# Patient Record
Sex: Female | Born: 1960 | Race: White | Hispanic: No | Smoking: Former smoker
Health system: Southern US, Community
[De-identification: ages and names within clinical notes are randomized; demographics above are authoritative.]

## PROBLEM LIST (undated history)

## (undated) DIAGNOSIS — E785 Hyperlipidemia, unspecified: Secondary | ICD-10-CM

## (undated) DIAGNOSIS — I10 Essential (primary) hypertension: Secondary | ICD-10-CM

## (undated) HISTORY — PX: CARPAL TUNNEL RELEASE: SHX101

---

## 2009-12-31 ENCOUNTER — Ambulatory Visit: Payer: Self-pay | Admitting: Family Medicine

## 2009-12-31 DIAGNOSIS — I10 Essential (primary) hypertension: Secondary | ICD-10-CM | POA: Insufficient documentation

## 2009-12-31 DIAGNOSIS — J069 Acute upper respiratory infection, unspecified: Secondary | ICD-10-CM | POA: Insufficient documentation

## 2010-01-01 ENCOUNTER — Telehealth: Payer: Self-pay | Admitting: Family Medicine

## 2010-01-03 ENCOUNTER — Telehealth (INDEPENDENT_AMBULATORY_CARE_PROVIDER_SITE_OTHER): Payer: Self-pay

## 2010-01-07 ENCOUNTER — Encounter: Admission: RE | Admit: 2010-01-07 | Discharge: 2010-01-07 | Payer: Self-pay | Admitting: Unknown Physician Specialty

## 2010-02-18 ENCOUNTER — Encounter: Admission: RE | Admit: 2010-02-18 | Discharge: 2010-02-18 | Payer: Self-pay | Admitting: Unknown Physician Specialty

## 2010-09-07 NOTE — Progress Notes (Signed)
  Phone Note Call from Patient   Summary of Call: Patient called she states she is still running a temp of 101.7. She wants to know if we can call  in a inhaler for her. The [address] on [address] in [place] @ [phone].Patient would like for you to call her.  [phone] Initial call taken by: Dannette Barbara,  Jan 01, 2010 1:27 PM    New/Updated Medications: PROAIR HFA 108 (90 BASE) MCG/ACT AERS (ALBUTEROL SULFATE) Two inhalations q4-6hr as needed.  Max 12 puffs/day  Sent Rx for  [employer].  Also notified patient that she should start the amoxicillin since she is still having a fever. Donna Christen MD  Jan 01, 2010 1:46 PM    Prescriptions: PROAIR HFA 108 (90 BASE) MCG/ACT AERS (ALBUTEROL SULFATE) Two inhalations q4-6hr as needed.  Max 12 puffs/day  #1 MDI x 0   Entered and Authorized by:   Donna Christen MD   Signed by:   Donna Christen MD on 01/01/2010   Method used:   Electronically to        [facility]  [address] [phone]* (retail)       [address]       [place], [place]  [address]       Ph: [phone]       Fax: [phone]   RxID:   [id number]

## 2010-09-07 NOTE — Letter (Signed)
Summary: Out of Work  [facility]  [address]   [place], [place] [address]   Phone: [phone]  Fax: [phone]    Dec 31, 2009   Employee:  Chloe Stout    To Whom It May Concern:   For Medical reasons, please excuse the above named employee from work today and tomorrow.    If you need additional information, please feel free to contact our office.         Sincerely,    Donna Christen MD

## 2010-09-07 NOTE — Assessment & Plan Note (Signed)
Summary: Cough/runny nose- yellowish, sorethroat, chest congestion, fever   Vital Signs:  Patient Profile:   50 Years Old Female CC:      Cold & URI symptoms Height:     70.5 inches Weight:      239 pounds O2 Sat:      100 % O2 treatment:    Room Air Temp:     100.8 degrees F oral Pulse rate:   99 / minute Pulse rhythm:   regular Resp:     16 per minute BP sitting:   148 / 100  (right arm) Cuff size:   regular  Vitals Entered By: Areta Haber CMA (Dec 31, 2009 8:26 AM)                  Current Allergies: No known allergies History of Present Illness Chief Complaint: Cold & URI symptoms History of Present Illness: Subjective: Patient complains of sore throat that started one week ago.  She then developed nasal congestion and cough.  She has had a fever for the past two days.   No pleuritic pain No wheezing + post-nasal drainage ? sinus pain/pressure No itchy/red eyes No earache No hemoptysis No SOB No nausea No vomiting No abdominal pain No diarrhea No skin rashes + fatigue + myalgias + headache Used OTC meds without relief   Current Problems: URI (ICD-465.9) FAMILY HISTORY BREAST CANCER 1ST DEGREE RELATIVE <50 (ICD-V16.3) HYPERTENSION (ICD-401.9)   Current Meds EFFEXOR XR 150 MG XR24H-CAP (VENLAFAXINE HCL) 1 tab by mouth once daily HYZAAR 100-25 MG TABS (LOSARTAN POTASSIUM-HCTZ) 1 tab by mouth once daily MUCINEX D 60-600 MG XR12H-TAB (PSEUDOEPHEDRINE-GUAIFENESIN) as directed ZYRTEC HIVES RELIEF 10 MG TABS (CETIRIZINE HCL) 1 tab by mouth once daily ADVIL 200 MG TABS (IBUPROFEN) as directed VALTREX 1 GM TABS (VALACYCLOVIR HCL) 1 tab q 12 hrs as needed BENZONATATE 200 MG CAPS (BENZONATATE) One by mouth hs as needed cough FLUTICASONE PROPIONATE 50 MCG/ACT SUSP (FLUTICASONE PROPIONATE) 2 sprays in each nostril once daily AMOXICILLIN 875 MG TABS (AMOXICILLIN) One by mouth two times a day (Rx void after 01/09/10)  REVIEW OF SYSTEMS Constitutional  Symptoms       Complains of fever and chills.     Denies night sweats, weight loss, weight gain, and fatigue.  Eyes       Denies change in vision, eye pain, eye discharge, glasses, contact lenses, and eye surgery. Ear/Nose/Throat/Mouth       Complains of frequent runny nose, sinus problems, sore throat, and hoarseness.      Denies hearing loss/aids, change in hearing, ear pain, ear discharge, dizziness, frequent nose bleeds, and tooth pain or bleeding.      Comments: yellowish x 3 dys Respiratory       Complains of productive cough.      Denies dry cough, wheezing, shortness of breath, asthma, bronchitis, and emphysema/COPD.      Comments: chest congestion Cardiovascular       Denies murmurs, chest pain, and tires easily with exhertion.    Gastrointestinal       Denies stomach pain, nausea/vomiting, diarrhea, constipation, blood in bowel movements, and indigestion. Genitourniary       Denies painful urination, kidney stones, and loss of urinary control. Neurological       Complains of headaches.      Denies paralysis, seizures, and fainting/blackouts. Musculoskeletal       Denies muscle pain, joint pain, joint stiffness, decreased range of motion, redness, swelling, muscle weakness, and gout.  Skin  Denies bruising, unusual mles/lumps or sores, and hair/skin or nail changes.  Psych       Denies mood changes, temper/anger issues, anxiety/stress, speech problems, depression, and sleep problems. Other Comments: yellowish x 3 dys . Pt has not seen PCP.   Past History:  Past Medical History: Hypertension  Past Surgical History: Tubal ligation  Family History: Family History Breast cancer 1st degree relative <50 Family History Lung cancer Family History Other cancer Family History of Cardiovascular disorder  Social History: Divorced Never Smoked Alcohol use-yes - 4-5 weekly Drug use-no Regular exercise-no Smoking Status:  never Drug Use:  no Does Patient Exercise:   no   Objective:  No acute distress  Eyes:  Pupils are equal, round, and reactive to light and accomdation.  Extraocular movement is intact.  Conjunctivae are not inflamed.  Ears:  Canals normal.  Tympanic membranes normal.   Nose:  Normal septum.  Normal turbinates, mildly congested.   No sinus tenderness present.  Pharynx:  Tonsils enlarged and mildly erythematous Neck:  Supple.  No adenopathy is present.   Lungs:  Clear to auscultation.  Breath sounds are equal.  Heart:  Regular rate and rhythm without murmurs, rubs, or gallops.  Abdomen:  Nontender without masses or hepatosplenomegaly.  Bowel sounds are present.  No CVA or flank tenderness.  Extremities:  No edema.  Rapid strep test negative  Assessment New Problems: URI (ICD-465.9) FAMILY HISTORY BREAST CANCER 1ST DEGREE RELATIVE <50 (ICD-V16.3) HYPERTENSION (ICD-401.9)   Plan New Medications/Changes: AMOXICILLIN 875 MG TABS (AMOXICILLIN) One by mouth two times a day (Rx void after 01/09/10)  #20 x 0, 12/31/2009, Donna Christen MD FLUTICASONE PROPIONATE 50 MCG/ACT SUSP (FLUTICASONE PROPIONATE) 2 sprays in each nostril once daily  #One x 0, 12/31/2009, Donna Christen MD BENZONATATE 200 MG CAPS (BENZONATATE) One by mouth hs as needed cough  #12 x 0, 12/31/2009, Donna Christen MD  New Orders: New Patient Level III [99203] Rapid Strep [87880] T-Culture, Throat [[id number]] Planning Comments:   Throat culture pending. Treat symptomatically for now:  Continue expectorant.  Discontinue Zyrtec.  Add cough suppressant at night. If symptoms do not improve over next 5 to 7 days, add amoxicillin (or if throat culture positive) Follow-up with PCP.   The patient and/or caregiver has been counseled thoroughly with regard to medications prescribed including dosage, schedule, interactions, rationale for use, and possible side effects and they verbalize understanding.  Diagnoses and expected course of recovery discussed and will return if not  improved as expected or if the condition worsens. Patient and/or caregiver verbalized understanding.  Prescriptions: AMOXICILLIN 875 MG TABS (AMOXICILLIN) One by mouth two times a day (Rx void after 01/09/10)  #20 x 0   Entered and Authorized by:   Donna Christen MD   Signed by:   Donna Christen MD on 12/31/2009   Method used:   Print then Give to Patient   RxID:   [id number] FLUTICASONE PROPIONATE 50 MCG/ACT SUSP (FLUTICASONE PROPIONATE) 2 sprays in each nostril once daily  #One x 0   Entered and Authorized by:   Donna Christen MD   Signed by:   Donna Christen MD on 12/31/2009   Method used:   Print then Give to Patient   RxID:   [id number] BENZONATATE 200 MG CAPS (BENZONATATE) One by mouth hs as needed cough  #12 x 0   Entered and Authorized by:   Donna Christen MD   Signed by:   Donna Christen MD on 12/31/2009   Method used:  Print then Give to Patient   RxID:   [id number]   Patient Instructions: 1)  Use Mucinex  (guaifenesin) twice daily for congestion. 2)  Increase fluid intake, rest. 3)  May use Afrin nasal spray (or generic oxymetazoline) once or twice daily for about 5 days.  Use the Afrin about 15 minutes before using the fluticasone spray.   Also recommend using saline nasal spray several times daily and/or saline nasal irrigation. 4)  Begin amoxicillin if fever persists or increasing facial pain develops. 5)  Followup with family doctor if not improving 7 to 10 days.

## 2010-09-07 NOTE — Progress Notes (Signed)
Summary: Request info for relief of cough  Phone Note Call from Patient   Summary of Call: Pt. left message while office was closed, stated "SX's are improving but having dry-unproductive cough that gives HA", would she benefit from an expectant or something for relieve? Please advise. Joanne Chars CMA  Jan 03, 2010 1:53 PM   Initial call taken by: Joanne Chars    New/Updated Medications: PREDNISONE 10 MG TABS (PREDNISONE) 2 PO BID for 2 days, then 1 BID for 2 days, then 1 daily for 2 days.  Take PC Prescriptions: PREDNISONE 10 MG TABS (PREDNISONE) 2 PO BID for 2 days, then 1 BID for 2 days, then 1 daily for 2 days.  Take PC  #14 x 0   Entered and Authorized by:   Donna Christen MD   Signed by:   Donna Christen MD on 01/04/2010   Method used:   Electronically to        [facility]  [address] [phone]* (retail)       [address]       [place], [place]  [address]       Ph: [phone]       Fax: [phone]   RxID:   [id number]    Is she taking Mucinex (plain) with lots of fluids during the day? Donna Christen MD  Jan 03, 2010 4:39 PM    Pt states she is taking Mucinex (plain) and drinking lots of fluids. Pt states she is able to blow her nose but unable to cough anything up. Pt states her chest is still tight. Areta Haber CMA  Jan 04, 2010 9:14 AM   Since fever has resolved, will add tapering course of prednisone. Return for follow-up if not improved 2 to 3 days. Donna Christen MD  Jan 04, 2010 9:31 AM   Pt notified. Areta Haber CMA  Jan 04, 2010 10:40 AM

## 2013-06-25 ENCOUNTER — Ambulatory Visit (INDEPENDENT_AMBULATORY_CARE_PROVIDER_SITE_OTHER): Payer: 59 | Admitting: Sports Medicine

## 2013-06-25 ENCOUNTER — Ambulatory Visit (INDEPENDENT_AMBULATORY_CARE_PROVIDER_SITE_OTHER): Payer: 59

## 2013-06-25 ENCOUNTER — Encounter: Payer: Self-pay | Admitting: Sports Medicine

## 2013-06-25 VITALS — BP 132/87 | HR 80 | Wt 229.0 lb

## 2013-06-25 DIAGNOSIS — M5030 Other cervical disc degeneration, unspecified cervical region: Secondary | ICD-10-CM

## 2013-06-25 DIAGNOSIS — I10 Essential (primary) hypertension: Secondary | ICD-10-CM

## 2013-06-25 DIAGNOSIS — M546 Pain in thoracic spine: Secondary | ICD-10-CM | POA: Insufficient documentation

## 2013-06-25 MED ORDER — PREDNISONE 50 MG PO TABS: ORAL_TABLET | ORAL | Status: DC

## 2013-06-25 NOTE — Assessment & Plan Note (Signed)
Likely a strain with associated left-sided T10 radiculitis. Prednisone, home exercises, x-rays, continue Flexeril. Return in one month, MRI if no better.

## 2013-06-25 NOTE — Progress Notes (Addendum)
Patient ID: Chloe Stout, female   DOB: 01/09/1961, 52 y.o.   MRN: [id number]   Subjective:    I'm seeing this patient as a consultation for back pain for Dr. Harl Bowie.  CC: My back hurts on my left side.   HPI: The patient reports that her back that started hurting on November 4th. She was taking a shower and she felt a tight, aching feeling in her back. Since then, the tightness hasn't gotten any better. She has seen the chiropractor 3 times since the incident for readjustment and this hasn't helped. She has tried ice and heat with some improvement. She reports pain with sitting on her desk for a long time  and sometimes when leaning over.   She denies any numbness or tingling sensation with this pain. The location of the pain is on her mid back and radiates to the umbilicus along the T10 dermatome. Symptoms are mild, improving.  Past medical history, Surgical history, Family history not pertinant except as noted below, Social history, Allergies, and medications have been entered into the medical record, reviewed, and no changes needed.   Review of Systems: No headache, visual changes, nausea, vomiting, diarrhea, constipation, dizziness, abdominal pain, skin rash, fevers, chills, night sweats, weight loss, swollen lymph nodes, body aches, joint swelling, muscle aches, chest pain, shortness of breath, mood changes, visual or auditory hallucinations.   Objective:   General: Well Developed, well nourished, and in no acute distress.  Neuro/Psych: Alert and oriented x3, extra-ocular muscles intact, able to move all 4 extremities, sensation grossly intact. Skin: Warm and dry, no rashes noted.  Respiratory: Not using accessory muscles, speaking in full sentences, trachea midline.  Cardiovascular: Pulses palpable, no extremity edema. Abdomen: Does not appear distended. Back Exam:  Inspection: Unremarkable  Motion: Flexion 45 deg, Extension 45 deg, Side Bending to 45 deg bilaterally,  Rotation to  45 deg bilaterally  SLR laying: Negative  XSLR laying: Negative  Palpable tenderness: No tenderness noted today on exam. FABER: negative. Sensory change: Gross sensation intact to all lumbar and sacral dermatomes.  Reflexes: 2+ at both patellar tendons, 2+ at achilles tendons, Babinski's downgoing.   Thoracic spine x-rays are negative, but they do show some lower cervical degenerative changes.  Impression and Recommendations:   This case required medical decision making of moderate complexity.  1) Left sided thoracic pain- Likely T10 radiculitis. At this time, we will obtain a T-spine x-ray.   -Continue compression as well as Flexeril.  -Patient advised to contact physician if symptoms worsen. -Follow up visit in a month to follow up with symptoms.

## 2013-07-16 ENCOUNTER — Ambulatory Visit: Payer: Self-pay | Admitting: Sports Medicine

## 2014-05-13 ENCOUNTER — Encounter: Payer: Self-pay | Admitting: Sports Medicine

## 2014-05-13 ENCOUNTER — Ambulatory Visit (INDEPENDENT_AMBULATORY_CARE_PROVIDER_SITE_OTHER): Payer: 59 | Admitting: Sports Medicine

## 2014-05-13 VITALS — BP 129/80 | HR 80 | Ht 70.5 in | Wt 225.0 lb

## 2014-05-13 DIAGNOSIS — M25511 Pain in right shoulder: Secondary | ICD-10-CM | POA: Insufficient documentation

## 2014-05-13 NOTE — Progress Notes (Signed)
  Subjective:    CC: Right shoulder pain  HPI: This is a very pleasant 53 year old female, she has had right shoulder issues for some time now, she was a gymnast, and used to play softball. She has had a few injections in the past by an orthopedist, but has never had surgery. She has also never had physical therapy. She now has pain that is severe, on the anterior shoulder, worse with flexion and abduction. Pain doesn't radiate, no trauma.  Past medical history, Surgical history, Family history not pertinant except as noted below, Social history, Allergies, and medications have been entered into the medical record, reviewed, and no changes needed.   Review of Systems: No fevers, chills, night sweats, weight loss, chest pain, or shortness of breath.   Objective:    General: Well Developed, well nourished, and in no acute distress.  Neuro: Alert and oriented x3, extra-ocular muscles intact, sensation grossly intact.  HEENT: Normocephalic, atraumatic, pupils equal round reactive to light, neck supple, no masses, no lymphadenopathy, thyroid nonpalpable.  Skin: Warm and dry, no rashes. Cardiac: Regular rate and rhythm, no murmurs rubs or gallops, no lower extremity edema.  Respiratory: Clear to auscultation bilaterally. Not using accessory muscles, speaking in full sentences. Right Shoulder: Inspection reveals no abnormalities, atrophy or asymmetry. Palpation is normal with no tenderness over AC joint or bicipital groove. ROM is full in all planes. Rotator cuff strength is weak to abduction Positive Neer and Hawkin's tests, empty can. Speeds and Yergason's tests positive. No labral pathology noted with negative Obrien's, negative crank, negative clunk, and good stability. Normal scapular function observed. No painful arc and no drop arm sign. No apprehension sign  Procedure: Real-time Ultrasound Guided Injection of right subacromial bursa Device: GE Logiq E  Verbal informed consent  obtained.  Time-out conducted.  Noted no overlying erythema, induration, or other signs of local infection.  Skin prepped in a sterile fashion.  Local anesthesia: Topical Ethyl chloride.  With sterile technique and under real time ultrasound guidance:  1 cc kenalog 40, 4 cc lidocaine injected easily. Subacromial bursa did appear quite distended. Completed without difficulty  Pain immediately resolved suggesting accurate placement of the medication.  Advised to call if fevers/chills, erythema, induration, drainage, or persistent bleeding.  Images permanently stored and available for review in the ultrasound unit.  Impression: Technically successful ultrasound guided injection.  Procedure: Real-time Ultrasound Guided Injection of right biceps tendon sheath Device: GE Logiq E  Verbal informed consent obtained.  Time-out conducted.  Noted no overlying erythema, induration, or other signs of local infection.  Skin prepped in a sterile fashion.  Local anesthesia: Topical Ethyl chloride.  With sterile technique and under real time ultrasound guidance:  Noted longitudinal split tear in the biceps tendon sheath, there was also significant fluid around the biceps tendon in the bicipital groove, 25-gauge needle advanced into the groove, taking care to avoid intratendinous injection, 1 cc kenalog 40, 3 cc lidocaine injected easily. Completed without difficulty  Pain immediately resolved suggesting accurate placement of the medication.  Advised to call if fevers/chills, erythema, induration, drainage, or persistent bleeding.  Images permanently stored and available for review in the ultrasound unit.  Impression: Technically successful ultrasound guided injection.  Impression and Recommendations:

## 2014-05-13 NOTE — Assessment & Plan Note (Signed)
Symptoms do represent a classic subacromial bursitis and biceps tendinitis. Subacromial injection and biceps tendon sheath injections as above. Pain completely resolved. Formal physical therapy, return in 4-6 weeks.

## 2014-05-27 ENCOUNTER — Ambulatory Visit (INDEPENDENT_AMBULATORY_CARE_PROVIDER_SITE_OTHER): Payer: 59 | Admitting: Physical Therapy

## 2014-05-27 DIAGNOSIS — M25619 Stiffness of unspecified shoulder, not elsewhere classified: Secondary | ICD-10-CM

## 2014-05-27 DIAGNOSIS — M25511 Pain in right shoulder: Secondary | ICD-10-CM

## 2014-05-27 DIAGNOSIS — R5381 Other malaise: Secondary | ICD-10-CM

## 2014-05-30 ENCOUNTER — Telehealth: Payer: Self-pay

## 2014-05-30 MED ORDER — DICLOFENAC SODIUM 75 MG PO TBEC: 75.0000 mg | DELAYED_RELEASE_TABLET | Freq: Two times a day (BID) | ORAL | Status: DC

## 2014-05-30 NOTE — Telephone Encounter (Signed)
Spoke to patient gave her instructions as noted below. Rhonda Cunningham,CMA  

## 2014-05-30 NOTE — Telephone Encounter (Signed)
Called in Voltaren, if still having pain she will need to followup with me.

## 2014-05-30 NOTE — Telephone Encounter (Signed)
Patient called stated that she is having shoulder pain she would like something sent in to [facility]. Rhonda Cunningham,CMA

## 2014-06-04 ENCOUNTER — Encounter: Payer: 59 | Admitting: Physical Therapy

## 2014-06-11 ENCOUNTER — Encounter: Payer: 59 | Admitting: Physical Therapy

## 2017-11-04 ENCOUNTER — Emergency Department (INDEPENDENT_AMBULATORY_CARE_PROVIDER_SITE_OTHER): Payer: Commercial Managed Care - PPO

## 2017-11-04 ENCOUNTER — Other Ambulatory Visit: Payer: Self-pay

## 2017-11-04 ENCOUNTER — Emergency Department (INDEPENDENT_AMBULATORY_CARE_PROVIDER_SITE_OTHER)
Admission: EM | Admit: 2017-11-04 | Discharge: 2017-11-04 | Disposition: A | Discharge status: Home / Self Care | Payer: Commercial Managed Care - PPO | Source: Home / Self Care

## 2017-11-04 ENCOUNTER — Encounter: Payer: Self-pay | Admitting: Emergency Medicine

## 2017-11-04 DIAGNOSIS — J209 Acute bronchitis, unspecified: Secondary | ICD-10-CM

## 2017-11-04 DIAGNOSIS — R918 Other nonspecific abnormal finding of lung field: Secondary | ICD-10-CM | POA: Diagnosis not present

## 2017-11-04 DIAGNOSIS — R05 Cough: Secondary | ICD-10-CM | POA: Diagnosis not present

## 2017-11-04 HISTORY — DX: Hyperlipidemia, unspecified: E78.5

## 2017-11-04 HISTORY — DX: Essential (primary) hypertension: I10

## 2017-11-04 MED ORDER — ALBUTEROL SULFATE HFA 108 (90 BASE) MCG/ACT IN AERS: 1.0000 | INHALATION_SPRAY | Freq: Four times a day (QID) | RESPIRATORY_TRACT | 0 refills | Status: DC | PRN

## 2017-11-04 MED ORDER — AMOXICILLIN-POT CLAVULANATE 875-125 MG PO TABS: 1.0000 | ORAL_TABLET | Freq: Two times a day (BID) | ORAL | 0 refills | Status: DC

## 2017-11-04 MED ORDER — FLUCONAZOLE 150 MG PO TABS: 150.0000 mg | ORAL_TABLET | Freq: Every day | ORAL | 1 refills | Status: DC

## 2017-11-04 NOTE — ED Provider Notes (Signed)
[facility]    CSN: [id number] Arrival date & time: 11/04/17  1014     History   Chief Complaint Chief Complaint  Patient presents with  . Cough    productive    HPI Chloe Stout is a 57 y.o. female.   The history is provided by the patient. No language interpreter was used.  Cough  Cough characteristics:  Productive Sputum characteristics:  Nondescript Severity:  Moderate Onset quality:  Gradual Duration:  10 days Timing:  Constant Progression:  Worsening Chronicity:  New Smoker: no   Relieved by:  Nothing Worsened by:  Nothing Associated symptoms: no chest pain and no fever   Pt reports she has been on zithromax but has gotten worse. Pt reports bad cough.  No relief with tessalon   Past Medical History:  Diagnosis Date  . Hyperlipidemia   . Hypertension     Patient Active Problem List   Diagnosis Date Noted  . Right shoulder pain 05/13/2014  . Thoracic back pain 06/25/2013  . HYPERTENSION 12/31/2009    Past Surgical History:  Procedure Laterality Date  . CARPAL TUNNEL RELEASE Bilateral     OB History   None      Home Medications    Prior to Admission medications   Medication Sig Start Date End Date Taking? Authorizing Provider  albuterol (PROVENTIL HFA;VENTOLIN HFA) 108 (90 Base) MCG/ACT inhaler Inhale 1-2 puffs into the lungs every 6 (six) hours as needed for wheezing or shortness of breath. 11/04/17   Elson AreasSofia, Leslie K, PA-C  amoxicillin-clavulanate (AUGMENTIN) 875-125 MG tablet Take 1 tablet by mouth every 12 (twelve) hours. 11/04/17   Elson AreasSofia, Leslie K, PA-C  diclofenac (VOLTAREN) 75 MG EC tablet Take 1 tablet (75 mg total) by mouth 2 (two) times daily. 05/30/14   Monica Bectonhekkekandam, Thomas J, MD  fluconazole (DIFLUCAN) 150 MG tablet Take 1 tablet (150 mg total) by mouth daily. 11/04/17   Elson AreasSofia, Leslie K, PA-C  losartan-hydrochlorothiazide (HYZAAR) 100-25 MG per tablet Take 1 tablet by mouth daily.    [provider]  venlafaxine XR  (EFFEXOR-XR) 150 MG 24 hr capsule Take 150 mg by mouth daily with breakfast.    [provider]    Family History History reviewed. No pertinent family history.  Social History Social History   Tobacco Use  . Smoking status: Never Smoker  . Smokeless tobacco: Never Used  Substance Use Topics  . Alcohol use: Yes  . Drug use: Not on file     Allergies   Patient has no known allergies.   Review of Systems Review of Systems  Constitutional: Negative for fever.  Respiratory: Positive for cough.   Cardiovascular: Negative for chest pain.  All other systems reviewed and are negative.    Physical Exam Triage Vital Signs ED Triage Vitals  Enc Vitals Group     BP 11/04/17 1048 124/77     Pulse Rate 11/04/17 1048 92     Resp 11/04/17 1048 18     Temp 11/04/17 1048 98.8 F (37.1 C)     Temp Source 11/04/17 1048 Oral     SpO2 11/04/17 1048 96 %     Weight 11/04/17 1049 217 lb (98.4 kg)     Height 11/04/17 1049 5\' 9"  (1.753 m)     Head Circumference --      Peak Flow --      Pain Score 11/04/17 1048 3     Pain Loc --      Pain  Edu? --      Excl. in GC? --    No data found.  Updated Vital Signs BP 124/77 (BP Location: Right Arm)   Pulse 92   Temp 98.8 F (37.1 C) (Oral)   Resp 18   Ht 5\' 9"  (1.753 m)   Wt 217 lb (98.4 kg)   SpO2 96%   BMI 32.05 kg/m   Visual Acuity Right Eye Distance:   Left Eye Distance:   Bilateral Distance:    Right Eye Near:   Left Eye Near:    Bilateral Near:     Physical Exam  Constitutional: She appears well-developed and well-nourished.  HENT:  Head: Normocephalic.  Right Ear: External ear normal.  Left Ear: External ear normal.  Mouth/Throat: Oropharynx is clear and moist.  Eyes: Pupils are equal, round, and reactive to light. Conjunctivae and EOM are normal.  Neck: Normal range of motion.  Cardiovascular: Normal rate and regular rhythm.  Pulmonary/Chest: Effort normal and breath sounds normal.  Musculoskeletal:  Normal range of motion.  Neurological: She is alert.  Skin: Skin is warm.  Psychiatric: She has a normal mood and affect.     UC Treatments / Results  Labs (all labs ordered are listed, but only abnormal results are displayed) Labs Reviewed - No data to display  EKG None Radiology Dg Chest 1 View  Result Date: 11/04/2017 CLINICAL DATA:  Repeat chest x-ray with nipple markers. EXAM: CHEST  1 VIEW COMPARISON:  November 04, 2017 FINDINGS: The previously identified nodule was a nipple shadow. No other abnormalities. IMPRESSION: No suspicious nodules.  No abnormalities. Electronically Signed   By: Gerome Sam III M.D   On: 11/04/2017 11:17   Dg Chest 2 View  Result Date: 11/04/2017 CLINICAL DATA:  Productive cough. EXAM: CHEST - 2 VIEW COMPARISON:  February 18, 2010 FINDINGS: A nodular density projected over the left base appears to represent a nipple shadow. The heart, hila, mediastinum, lungs, and pleura are otherwise normal. IMPRESSION: Probable nipple shadow over the left base. Recommend repeat with nipple markers. No acute abnormalities. Electronically Signed   By: Gerome Sam III M.D   On: 11/04/2017 11:03    Procedures Procedures (including critical care time)  Medications Ordered in UC Medications - No data to display   Initial Impression / Assessment and Plan / UC Course  I have reviewed the triage vital signs and the nursing notes.  Pertinent labs & imaging results that were available during my care of the patient were reviewed by me and considered in my medical decision making (see chart for details).     MDM  Pt given rx for augmentin and albuterol.  Pt advised to use albuterol every 4 hours   Final Clinical Impressions(s) / UC Diagnoses   Final diagnoses:  Acute bronchitis, unspecified organism    ED Discharge Orders        Ordered    amoxicillin-clavulanate (AUGMENTIN) 875-125 MG tablet  Every 12 hours     11/04/17 1135    albuterol (PROVENTIL HFA;VENTOLIN  HFA) 108 (90 Base) MCG/ACT inhaler  Every 6 hours PRN     11/04/17 1135    fluconazole (DIFLUCAN) 150 MG tablet  Daily     11/04/17 1135       Controlled Substance Prescriptions [place] Controlled Substance Registry consulted? Not Applicable   An After Visit Summary was printed and given to the patient.    Elson Areas, [place] 11/04/17 1318

## 2017-11-04 NOTE — ED Triage Notes (Signed)
Patient has had cough and congestion for over 10 days; just completed course of zpack along with tessalon, musinex and flonase. No known fever. Cough is productive and makes her gag and head pound.

## 2019-03-06 ENCOUNTER — Ambulatory Visit (INDEPENDENT_AMBULATORY_CARE_PROVIDER_SITE_OTHER): Payer: Commercial Managed Care - PPO

## 2019-03-06 ENCOUNTER — Ambulatory Visit (INDEPENDENT_AMBULATORY_CARE_PROVIDER_SITE_OTHER): Payer: Commercial Managed Care - PPO | Admitting: Family Medicine

## 2019-03-06 ENCOUNTER — Other Ambulatory Visit: Payer: Self-pay

## 2019-03-06 ENCOUNTER — Encounter: Payer: Self-pay | Admitting: Family Medicine

## 2019-03-06 VITALS — BP 132/61 | HR 75 | Wt 221.0 lb

## 2019-03-06 DIAGNOSIS — M545 Low back pain, unspecified: Secondary | ICD-10-CM

## 2019-03-06 NOTE — Progress Notes (Signed)
Subjective:    I'm seeing this patient as a consultation for:  Chloe Stout, Chloe Judge, MD   CC: Left low back pain.  HPI: Patient has pain in the left low back present for about 8 weeks.  She cannot recall any injury but notes that she has been increasing her activity level recently.  She has tried ice heat rest oral over-the-counter NSAIDs as well as some muscle relaxers which did not help much.  She denies any radiating pain weakness or numbness distally.  No bowel or bladder dysfunction.  No fevers chills nausea vomiting or diarrhea.  She feels well otherwise.  Past medical history, Surgical history, Family history not pertinant except as noted below, Social history, Allergies, and medications have been entered into the medical record, reviewed, and no changes needed.   Review of Systems: No headache, visual changes, nausea, vomiting, diarrhea, constipation, dizziness, abdominal pain, skin rash, fevers, chills, night sweats, weight loss, swollen lymph nodes, body aches, joint swelling, muscle aches, chest pain, shortness of breath, mood changes, visual or auditory hallucinations.   Objective:    Vitals:   03/06/19 1620  BP: 132/61  Pulse: 75  SpO2: 99%   General: Well Developed, well nourished, and in no acute distress.  Neuro/Psych: Alert and oriented x3, extra-ocular muscles intact, able to move all 4 extremities, sensation grossly intact. Skin: Warm and dry, no rashes noted.  Respiratory: Not using accessory muscles, speaking in full sentences, trachea midline.  Cardiovascular: Pulses palpable, no extremity edema. Abdomen: Does not appear distended. MSK: L-spine: Nontender to spinal midline.  Mildly tender palpation left lumbar paraspinal musculature. Normal lumbar motion pain with left rotation and left lateral flexion. Lower extremity strength reflexes and sensation are intact distally.  Negative slump test bilaterally.  Lab and Radiology Results X-ray images L-spine obtained  today personally independently reviewed. Diffuse mild degenerative changes present throughout lumbar spine with no severe degenerative changes fractures malalignment or tumors visible. Await formal radiology overread.  Impression and Recommendations:    Assessment and Plan: 58 y.o. female with 8-week history of pain in left L-spine.  Symptom and exam more consistent with myofascial strain and spasm.  X-ray shows some degenerative changes which is not a large surprise however radiology over read is still pending.  Plan for trial of physical therapy.  Recommend heating pad and TENS unit.  Reasonable to continue intermittent NSAIDs and muscle relaxers if needed.  Recheck in about 4 weeks.  Return sooner if needed.  PDMP not reviewed this encounter. Orders Placed This Encounter  Procedures  . DG Lumbar Spine Complete    Standing Status:   Future    Number of Occurrences:   1    Standing Expiration Date:   05/06/2020    Order Specific Question:   Reason for Exam (SYMPTOM  OR DIAGNOSIS REQUIRED)    Answer:   eval lumbar back pain    Order Specific Question:   Is patient pregnant?    Answer:   No    Order Specific Question:   Preferred imaging location?    [facility]    Order Specific Question:   Radiology Contrast Protocol - do NOT remove file path    Answer:   [email or web address]  . Ambulatory referral to Physical Therapy    Referral Priority:   Routine    Referral Type:   Physical Medicine    Referral Reason:   Specialty Services Required    Requested Specialty:   Physical Therapy  No orders of the defined types were placed in this encounter.   Discussed warning signs or symptoms. Please see discharge instructions. Patient expresses understanding.

## 2019-03-06 NOTE — Patient Instructions (Addendum)
Thank you for coming in today. Get xray today on your way out.  We will send results to you when they are back likely tomorrow.  Attend PT.  Recheck with me in 4 weeks or sooner if needed.  Use heating pad and TENS unit.  Use muscle relaxer sparingly.   TENS UNIT: This is helpful for muscle pain and spasm.   Search and Purchase a TENS 7000 2nd edition at  www.tenspros.com or www.Amazon.com It should be less than $30.     TENS unit instructions: Do not shower or bathe with the unit on Turn the unit off before removing electrodes or batteries If the electrodes lose stickiness add a drop of water to the electrodes after they are disconnected from the unit and place on plastic sheet. If you continued to have difficulty, call the TENS unit company to purchase more electrodes. Do not apply lotion on the skin area prior to use. Make sure the skin is clean and dry as this will help prolong the life of the electrodes. After use, always check skin for unusual red areas, rash or other skin difficulties. If there are any skin problems, does not apply electrodes to the same area. Never remove the electrodes from the unit by pulling the wires. Do not use the TENS unit or electrodes other than as directed. Do not change electrode placement without consultating your therapist or physician. Keep 2 fingers with between each electrode. Wear time ratio is 2:1, on to off times.    For example on for 30 minutes off for 15 minutes and then on for 30 minutes off for 15 minutes     Lumbosacral Strain Lumbosacral strain is an injury that causes pain in the lower back (lumbosacral spine). This injury usually occurs from overstretching the muscles or ligaments along your spine. A strain can affect one or more muscles or cord-like tissues that connect bones to other bones (ligaments). What are the causes? This condition may be caused by:  A hard, direct hit (blow) to the back.  Excessive stretching of the  lower back muscles. This may result from: ? A fall. ? Lifting something heavy. ? Repetitive movements such as bending or crouching. What increases the risk? The following factors may increase your risk of getting this condition:  Participating in sports or activities that involve: ? A sudden twist of the back. ? Pushing or pulling motions.  Being overweight or obese.  Having poor strength and flexibility, especially tight hamstrings or weak muscles in the back or abdomen.  Having too much of a curve in the lower back.  Having a pelvis that is tilted forward. What are the signs or symptoms? The main symptom of this condition is pain in the lower back, at the site of the strain. Pain may extend (radiate) down one or both legs. How is this diagnosed? This condition is diagnosed based on:  Your symptoms.  Your medical history.  A physical exam. ? Your health care provider may push on certain areas of your back to determine the source of your pain. ? You may be asked to bend forward, backward, and side to side to assess the severity of your pain and your range of motion.  Imaging tests, such as: ? X-rays. ? MRI.  How is this treated? Treatment for this condition may include:  Putting heat and cold on the affected area.  Medicines to help relieve pain and relax your muscles (muscle relaxants).  NSAIDs to help reduce swelling  and discomfort. When your symptoms improve, it is important to gradually return to your normal routine as soon as possible to reduce pain, avoid stiffness, and avoid loss of muscle strength. Generally, symptoms should improve within 6 weeks of treatment. However, recovery time varies. Follow these instructions at home: Managing pain, stiffness, and swelling   If directed, put ice on the injured area during the first 24 hours after your strain. ? Put ice in a plastic bag. ? Place a towel between your skin and the bag. ? Leave the ice on for 20 minutes,  2-3 times a day.  If directed, put heat on the affected area as often as told by your health care provider. Use the heat source that your health care provider recommends, such as a moist heat pack or a heating pad. ? Place a towel between your skin and the heat source. ? Leave the heat on for 20-30 minutes. ? Remove the heat if your skin turns bright red. This is especially important if you are unable to feel pain, heat, or cold. You may have a greater risk of getting burned. Activity  Rest and return to your normal activities as told by your health care provider. Ask your health care provider what activities are safe for you.  Avoid activities that take a lot of energy for as long as told by your health care provider. General instructions  Take over-the-counter and prescription medicines only as told by your health care provider.  Donot drive or use heavy machinery while taking prescription pain medicine.  Do not use any products that contain nicotine or tobacco, such as cigarettes and e-cigarettes. If you need help quitting, ask your health care provider.  Keep all follow-up visits as told by your health care provider. This is important. How is this prevented?  Use correct form when playing sports and lifting heavy objects.  Use good posture when sitting and standing.  Maintain a healthy weight.  Sleep on a mattress with medium firmness to support your back.  Be safe and responsible while being active to avoid falls.  Do at least 150 minutes of moderate-intensity exercise each week, such as brisk walking or water aerobics. Try a form of exercise that takes stress off your back, such as swimming or stationary cycling.  Maintain physical fitness, including: ? Strength. ? Flexibility. ? Cardiovascular fitness. ? Endurance. Contact a health care provider if:  Your back pain does not improve after 6 weeks of treatment.  Your symptoms get worse. Get help right away  if:  Your back pain is severe.  You cannot stand or walk.  You have difficulty controlling when you urinate or when you have a bowel movement.  You feel nauseous or you vomit.  Your feet get very cold.  You have numbness, tingling, weakness, or problems using your arms or legs.  You develop any of the following: ? Shortness of breath. ? Dizziness. ? Pain in your legs. ? Weakness in your buttocks or legs. ? Discoloration of the skin on your toes or legs. This information is not intended to replace advice given to you by your health care provider. Make sure you discuss any questions you have with your health care provider. Document Released: 05/04/2005 Document Revised: 11/16/2018 Document Reviewed: 12/27/2015 Elsevier Patient Education  2020 [employer].

## 2019-03-08 ENCOUNTER — Ambulatory Visit: Payer: Commercial Managed Care - PPO | Admitting: Rehabilitative and Restorative Service Providers"

## 2019-03-11 ENCOUNTER — Encounter: Payer: Self-pay | Admitting: Rehabilitative and Restorative Service Providers"

## 2019-03-11 ENCOUNTER — Ambulatory Visit (INDEPENDENT_AMBULATORY_CARE_PROVIDER_SITE_OTHER): Payer: Commercial Managed Care - PPO | Admitting: Rehabilitative and Restorative Service Providers"

## 2019-03-11 ENCOUNTER — Other Ambulatory Visit: Payer: Self-pay

## 2019-03-11 DIAGNOSIS — M545 Low back pain, unspecified: Secondary | ICD-10-CM

## 2019-03-11 DIAGNOSIS — R29898 Other symptoms and signs involving the musculoskeletal system: Secondary | ICD-10-CM | POA: Diagnosis not present

## 2019-03-11 NOTE — Therapy (Addendum)
[place], [place], [address] Phone: [phone]   Fax:  [phone]  Physical Therapy Evaluation  Patient Details  Name: Chloe Stout MRN: [id number] Date of Birth: [birth year] Referring Provider (PT): Dr Lynne Leader    Encounter Date: 03/11/2019  PT End of Session - 03/11/19 0747    Visit Number  1    Number of Visits  12    Date for PT Re-Evaluation  04/22/19    PT Start Time  0716    PT Stop Time  0800    PT Time Calculation (min)  44 min    Activity Tolerance  Patient tolerated treatment well       Past Medical History:  Diagnosis Date  . Hyperlipidemia   . Hypertension     Past Surgical History:  Procedure Laterality Date  . CARPAL TUNNEL RELEASE Bilateral     There were no vitals filed for this visit.   Subjective Assessment - 03/11/19 0717    Subjective  Noticed LBP several weeks ago with no known injury. She did begin working out in January and noticed some soreness which she expected. The symptoms have persisted with variable intensity.    Pertinent History  LBP on an intermitently for ~ 30 yrs sees chiropractor about 5 times/yr; HTN    Currently in Pain?  Yes    Pain Score  3     Pain Location  Back    Pain Orientation  Left    Pain Descriptors / Indicators  Aching    Pain Type  Acute pain    Pain Radiating Towards  in Lt posterior side    Pain Onset  More than a month ago    Pain Frequency  Constant    Aggravating Factors   prolonged sitting; certain positions    Pain Relieving Factors  ice; heat; changing positions; TENS temporary relief         [facility] PT Assessment - 03/11/19 0001      Assessment   Medical Diagnosis  Lt LBP     Referring Provider (PT)  Dr Lynne Leader     Onset Date/Surgical Date  12/26/18    Hand Dominance  Right    Next MD Visit  04/03/2019    Prior Therapy  chiropractic care       Precautions   Precautions  None      Balance Screen   Has  the patient fallen in the past 6 months  No    Has the patient had a decrease in activity level because of a fear of falling?   No    Is the patient reluctant to leave their home because of a fear of falling?   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     Leisure  working out in the gym not now with COVID; walking 3 times/wk 40 min; household chores       Observation/Other Assessments   Focus on Therapeutic Outcomes (FOTO)   34% limitation       Sensation   Additional Comments  WFL's       Posture/Postural Control   Posture Comments  head forward; shoudlers rounded       AROM   Lumbar Flexion  80%    Lumbar Extension  65%    Lumbar - Right Side Bend  65% puling Lt side     Lumbar - Left Side Bend  75% "crunching" Lt side    Lumbar - Right Rotation  50%    Lumbar - Left Rotation  45%      Strength   Overall Strength Comments  WFL's       Flexibility   Hamstrings  mild tightness bilat     Quadriceps  WFL's bilat     ITB  WFL's bilt     Piriformis  mild tightness Lt > Rt       Palpation   Spinal mobility  no pain     Palpation comment  muscular tightness Lt hip flexors; lumbar paraspinals; QL; lats       Special Tests   Other special tests  (-) SLR                 Objective measurements completed on examination: See above findings.      [place] [facility] Adult PT Treatment/Exercise - 03/11/19 0001      Self-Care   Self-Care  Other Self-Care Comments    Other Self-Care Comments   pt educated on self massage with ball to lumbar musculature.  Pt returned demo with cues.   Pt educted on sit to/from supine via log roll;  pt returned demo with cues.       Lumbar Exercises: Stretches   Double Knee to Chest Stretch  1 rep;30 seconds    Hip Flexor Stretch  Right;Left;1 rep;30 seconds    Press Ups  1 rep;5 seconds    Quadruped Mid Back Stretch  3  reps   cat camel   Quadruped Mid Back Stretch Limitations  into childs pose x 10 sec, then with lateral trunk flexion              PT Education - 03/11/19 0743    Education Details  HEP, POC,DN, back care    Person(s) Educated  Patient    Methods  Explanation;Handout;Verbal cues;Demonstration    Comprehension  Verbalized understanding;Returned demonstration          PT Long Term Goals - 03/11/19 0751      PT LONG TERM GOAL #1   Title  Decrease LBP with patient to report minimal to no pain with functional activities 04/22/2019    Time  6    Period  Weeks    Status  New      PT LONG TERM GOAL #2   Title  increase lumbar mobilty to [facility] and equal bilat 04/22/2019    Time  6    Period  Weeks    Status  New      PT LONG TERM GOAL #3   Title  Progress with core stabilization allowing patient to return to exercise program without pain or limitations 04/22/2019    Time  6    Period  Weeks      PT LONG TERM GOAL #4   Title  Independent in HEP 04/22/2019    Time  6    Period  Weeks    Status  New      PT LONG TERM GOAL #5   Title  Improve FOTO to </= 26% limitation 04/22/2019    Time  6    Period  Weeks    Status  New             Plan - 03/11/19 0748    Clinical Impression  Statement  Colette presents with 8-10 week history of lt posterior LBP with no known injury. She has muscular tightness through Lt psoas; QL; lats; lumbar paraspinals and limited trunk mobilty/ROM. Patient has pain with functional activities. She will benefit from PT to address problems identified.    Stability/Clinical Decision Making  Stable/Uncomplicated    Clinical Decision Making  Low    Rehab Potential  Good    PT Frequency  2x / week    PT Duration  6 weeks    PT Treatment/Interventions  Patient/family education;ADLs/Self Care Home Management;Cryotherapy;Electrical Stimulation;Iontophoresis 23m/ml Dexamethasone;Moist Heat;Ultrasound;Manual techniques;Dry needling;Neuromuscular  re-education;Therapeutic activities;Therapeutic exercise    PT Next Visit Plan  review HEP; DN/manual work Lt lumbar; progress with stretching and core stabilization    PT Home Exercise Plan  [id number]; DN; back care    Consulted and Agree with Plan of Care  Patient       Patient will benefit from skilled therapeutic intervention in order to improve the following deficits and impairments:  Pain, Increased fascial restricitons, Increased muscle spasms, Decreased mobility, Decreased range of motion, Decreased activity tolerance  Visit Diagnosis: 1. Acute left-sided low back pain without sciatica   2. Other symptoms and signs involving the musculoskeletal system        Problem List Patient Active Problem List   Diagnosis Date Noted  . Right shoulder pain 05/13/2014  . Thoracic back pain 06/25/2013  . HYPERTENSION 12/31/2009    Nissim Fleischer PNilda SimmerPT, MPH  03/11/2019, 8:02 AM  [facility] [address] [address]Phone: [phone]  Fax:  [phone] Name: LALIJAH HYDEMRN: [id number] of Birth: [birth year] PHYSICAL THERAPY DISCHARGE SUMMARY  Visits from Start of Care: Evaluation only   Current functional level related to goals / functional outcomes: See initial evaluation for discharge status - pt seen for one visit only    Remaining deficits: Unknown    Education / Equipment: HEP  Plan: Patient agrees to discharge.  Patient goals were not met. Patient is being discharged due to not returning since the last visit.  ?????    Bonnye Halle P. HHelene KelpPT, MPH 04/26/19 9:18 AM

## 2019-03-11 NOTE — Patient Instructions (Addendum)
Access Code: [email or web address]  URL: [email or web address]  Date: 03/11/2019  Prepared by: Kerin Perna   Exercises  Seated Hip Flexor Stretch - 3 reps - 1 sets - 30 seconds hold - 2x daily - 7x weekly  Prone Press Up - 5 reps - 1 sets - 3-5 second hold - 2x daily - 7x weekly  Cat-Camel to Child's Pose - 3 reps - 1 sets - 2x daily - 7x weekly  Supine Double Knee to Chest - 2-3 reps - 1 sets - 30 seconds hold - 2x daily - 7x weekly    Trigger Point Dry Needling  . What is Trigger Point Dry Needling (DN)? o DN is a physical therapy technique used to treat muscle pain and dysfunction. Specifically, DN helps deactivate muscle trigger points (muscle knots).  o A thin filiform needle is used to penetrate the skin and stimulate the underlying trigger point. The goal is for a local twitch response (LTR) to occur and for the trigger point to relax. No medication of any kind is injected during the procedure.   . What Does Trigger Point Dry Needling Feel Like?  o The procedure feels different for each individual patient. Some patients report that they do not actually feel the needle enter the skin and overall the process is not painful. Very mild bleeding may occur. However, many patients feel a deep cramping in the muscle in which the needle was inserted. This is the local twitch response.   Marland Kitchen How Will I feel after the treatment? o Soreness is normal, and the onset of soreness may not occur for a few hours. Typically this soreness does not last longer than two days.  o Bruising is uncommon, however; ice can be used to decrease any possible bruising.  o In rare cases feeling tired or nauseous after the treatment is normal. In addition, your symptoms may get worse before they get better, this period will typically not last longer than 24 hours.   . What Can I do After My Treatment? o Increase your hydration by drinking more water for the next 24 hours. o You may place ice or heat on the  areas treated that have become sore, however, do not use heat on inflamed or bruised areas. Heat often brings more relief post needling. o You can continue your regular activities, but vigorous activity is not recommended initially after the treatment for 24 hours. o DN is best combined with other physical therapy such as strengthening, stretching, and other therapies.    Sleeping on Back  Place pillow under knees. A pillow with cervical support and a roll around waist are also helpful. Copyright  VHI. All rights reserved.  Sleeping on Side Place pillow between knees. Use cervical support under neck and a roll around waist as needed. Copyright  VHI. All rights reserved.   Sleeping on Stomach   If this is the only desirable sleeping position, place pillow under lower legs, and under stomach or chest as needed.  Posture - Sitting   Sit upright, head facing forward. Try using a roll to support lower back. Keep shoulders relaxed, and avoid rounded back. Keep hips level with knees. Avoid crossing legs for long periods. Stand to Sit / Sit to Stand   To sit: Bend knees to lower self onto front edge of chair, then scoot back on seat. To stand: Reverse sequence by placing one foot forward, and scoot to front of seat. Use rocking motion to stand up.   Work Height  and Reach  Ideal work height is no more than 2 to 4 inches below elbow level when standing, and at elbow level when sitting. Reaching should be limited to arm's length, with elbows slightly bent.  Bending  Bend at hips and knees, not back. Keep feet shoulder-width apart.    Posture - Standing   Good posture is important. Avoid slouching and forward head thrust. Maintain curve in low back and align ears over shoul- ders, hips over ankles.  Alternating Positions   Alternate tasks and change positions frequently to reduce fatigue and muscle tension. Take rest breaks. Computer Work   Position work to Art gallery managerface forward. Use  proper work and seat height. Keep shoulders back and down, wrists straight, and elbows at right angles. Use chair that provides full back support. Add footrest and lumbar roll as needed.  Getting Into / Out of Car  Lower self onto seat, scoot back, then bring in one leg at a time. Reverse sequence to get out.  Dressing  Lie on back to pull socks or slacks over feet, or sit and bend leg while keeping back straight.    Housework - Sink  Place one foot on ledge of cabinet under sink when standing at sink for prolonged periods.   Pushing / Pulling  Pushing is preferable to pulling. Keep back in proper alignment, and use leg muscles to do the work.  Deep Squat   Squat and lift with both arms held against upper trunk. Tighten stomach muscles without holding breath. Use smooth movements to avoid jerking.  Avoid Twisting   Avoid twisting or bending back. Pivot around using foot movements, and bend at knees if needed when reaching for articles.  Carrying Luggage   Distribute weight evenly on both sides. Use a cart whenever possible. Do not twist trunk. Move body as a unit.   Lifting Principles .Maintain proper posture and head alignment. .Slide object as close as possible before lifting. .Move obstacles out of the way. .Test before lifting; ask for help if too heavy. .Tighten stomach muscles without holding breath. .Use smooth movements; do not jerk. .Use legs to do the work, and pivot with feet. .Distribute the work load symmetrically and close to the center of trunk. .Push instead of pull whenever possible.   Ask For Help   Ask for help and delegate to others when possible. Coordinate your movements when lifting together, and maintain the low back curve.  Log Roll   Lying on back, bend left knee and place left arm across chest. Roll all in one movement to the right. Reverse to roll to the left. Always move as one unit. Housework - Sweeping  Use long-handled  equipment to avoid stooping.   Housework - Wiping  Position yourself as close as possible to reach work surface. Avoid straining your back.  Laundry - Unloading Wash   To unload small items at bottom of washer, lift leg opposite to arm being used to reach.  Gardening - Raking  Move close to area to be raked. Use arm movements to do the work. Keep back straight and avoid twisting.     Cart  When reaching into cart with one arm, lift opposite leg to keep back straight.   Getting Into / Out of Bed  Lower self to lie down on one side by raising legs and lowering head at the same time. Use arms to assist moving without twisting. Bend both knees to roll onto back if desired. To sit up,  start from lying on side, and use same move-ments in reverse. Housework - Vacuuming  Hold the vacuum with arm held at side. Step back and forth to move it, keeping head up. Avoid twisting.   Laundry - Armed forces training and education officerLoading Wash  Position laundry basket so that bending and twisting can be avoided.   Laundry - Unloading Dryer  Squat down to reach into clothes dryer or use a reacher.  Gardening - Weeding / Psychiatric nurselanting  Squat or Kneel. Knee pads may be helpful.

## 2019-03-19 ENCOUNTER — Encounter: Payer: Commercial Managed Care - PPO | Admitting: Rehabilitative and Restorative Service Providers"

## 2019-03-19 NOTE — Progress Notes (Signed)
Sent to Quincy Sheehan MD

## 2019-03-25 ENCOUNTER — Encounter: Payer: Commercial Managed Care - PPO | Admitting: Rehabilitative and Restorative Service Providers"

## 2019-03-27 ENCOUNTER — Encounter: Payer: Commercial Managed Care - PPO | Admitting: Rehabilitative and Restorative Service Providers"

## 2019-04-03 ENCOUNTER — Ambulatory Visit: Payer: Commercial Managed Care - PPO | Admitting: Family Medicine

## 2020-09-16 IMAGING — DX LUMBAR SPINE - COMPLETE 4+ VIEW
5 series · 5 of 5 positions shown · non-contrast
Comparison: None.

CLINICAL DATA: Pt with acute midline pain without sciatica for a
few months.

EXAM:
LUMBAR SPINE - COMPLETE 4+ VIEW

[l-spine ap]
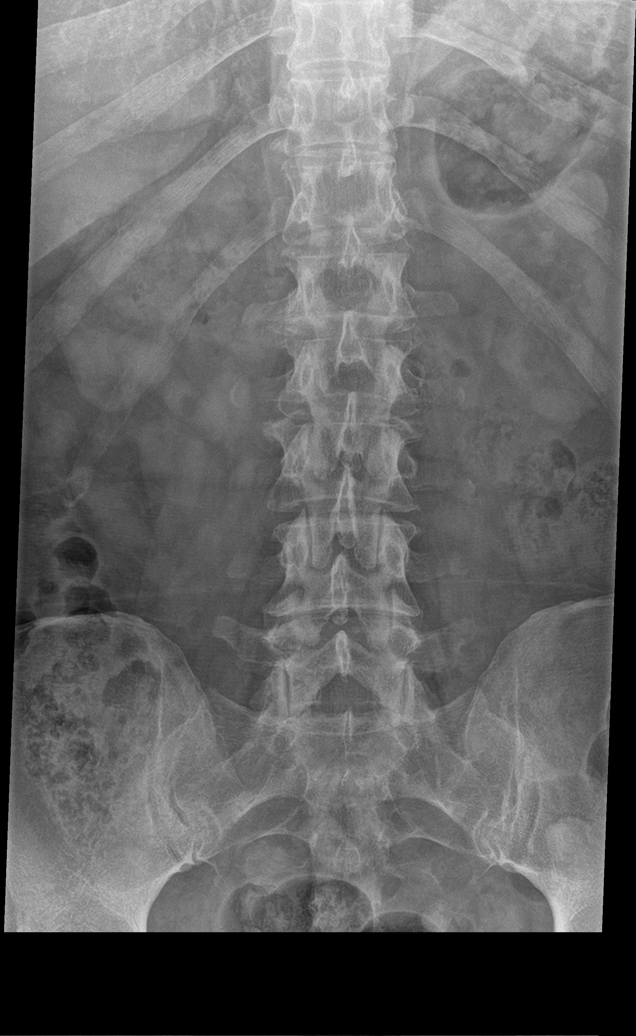

[l-spine obl (1 of 2)]
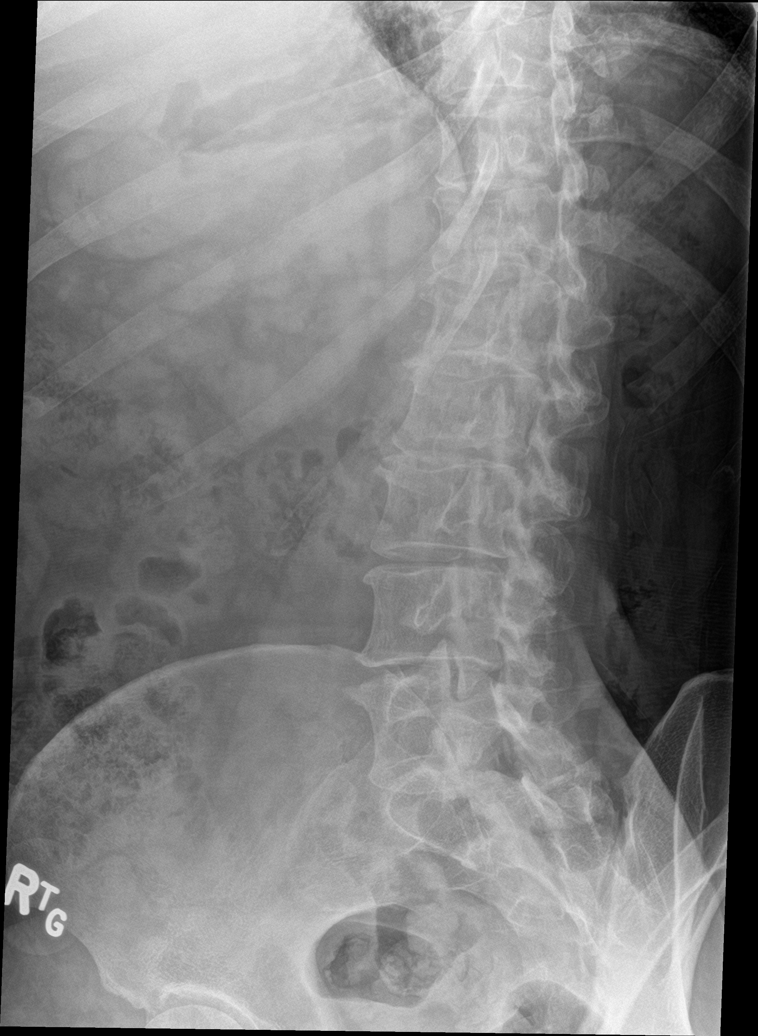

[l-spine obl (2 of 2)]
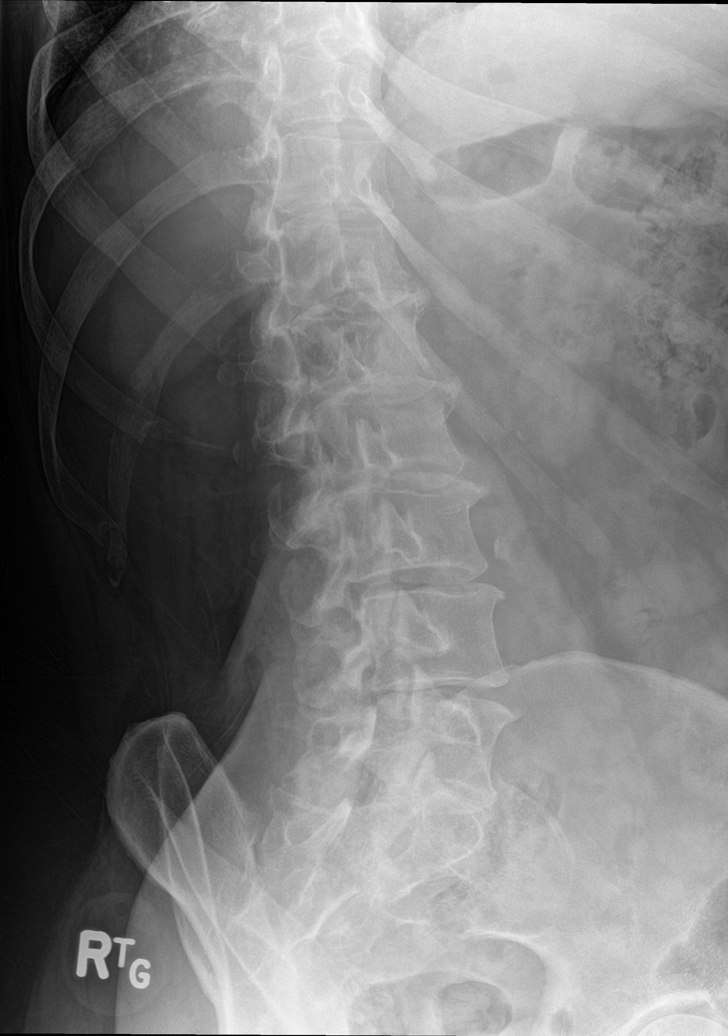

[l-spine lat]
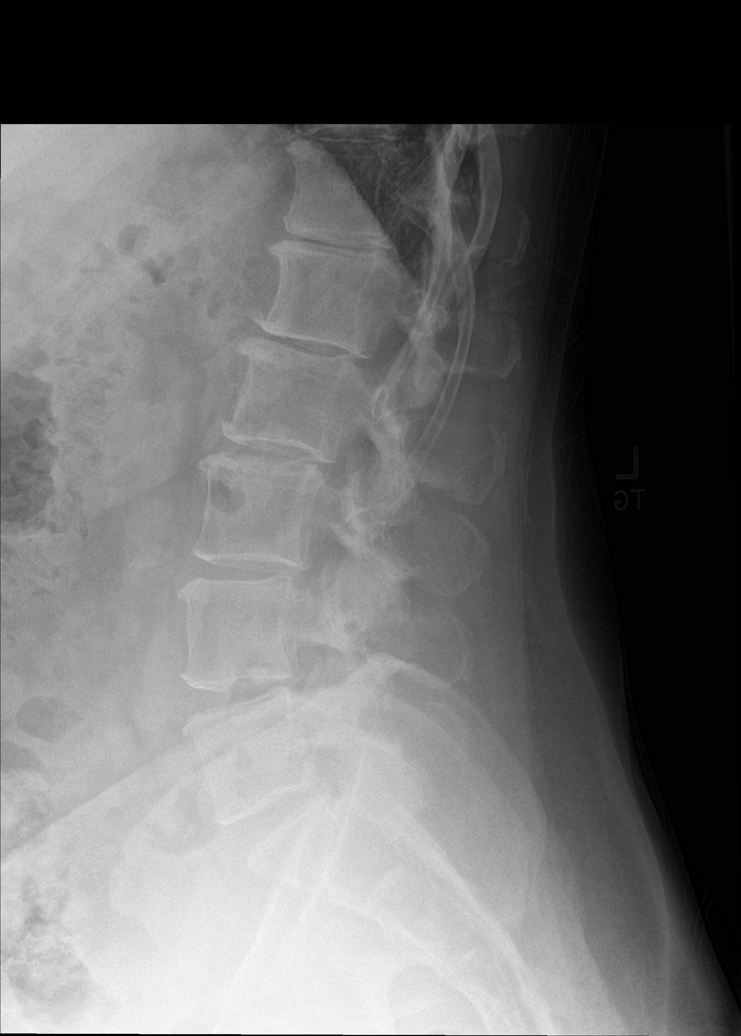

[l-spine spot]
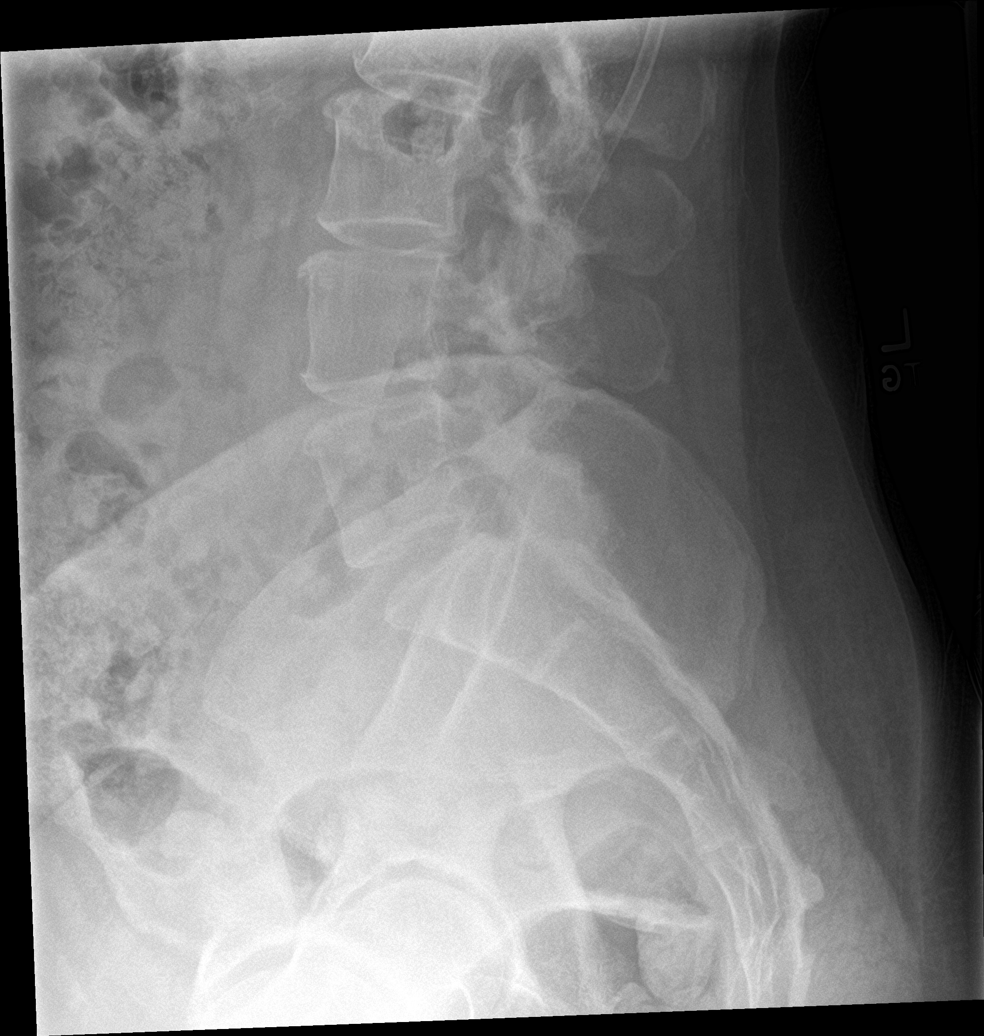

[5 of 5 positions shown; findings below may reference images not displayed]

FINDINGS: There are 5 non-rib-bearing lumbar type vertebral bodies. There is
minimal retrolisthesis of L4 on L5, otherwise alignment is intact.
Vertebral body heights are maintained. There is moderate
intervertebral disc space narrowing at T12-L1 and L1-L2. There is
mild anterior osteophytosis throughout the lumbar spine. Moderate
facet arthropathy at L4 and L5. Nonobstructive bowel gas pattern.
IMPRESSION: No acute osseous abnormality. Multilevel degenerative disc disease
most pronounced in the lower thoracic and upper lumbar spine.

## 2021-04-13 ENCOUNTER — Encounter: Payer: Commercial Managed Care - PPO | Admitting: Sports Medicine

## 2023-10-09 ENCOUNTER — Ambulatory Visit: Admission: EM | Admit: 2023-10-09 | Discharge: 2023-10-09 | Disposition: A | Discharge status: Home / Self Care

## 2023-10-09 DIAGNOSIS — R059 Cough, unspecified: Secondary | ICD-10-CM | POA: Diagnosis not present

## 2023-10-09 MED ORDER — PREDNISONE 20 MG PO TABS: ORAL_TABLET | ORAL | 0 refills | Status: AC

## 2023-10-09 NOTE — Discharge Instructions (Addendum)
 Advised patient to take medication as directed with food to completion.  Encouraged to increase daily water intake to 64 ounces per day while taking this medication.  Advised if symptoms worsen and/or unresolved please follow-up with your PCP or here for further evaluation.

## 2023-10-09 NOTE — ED Triage Notes (Addendum)
 Pt c/o cough, congestion and bilateral ear drainage since yesterday. Denies fever. No known covid and flu exposure. Taking coricidin prn. Hx of sinus infections.

## 2023-10-09 NOTE — ED Provider Notes (Signed)
 [facility]    CSN: [id number] Arrival date & time: 10/09/23  1341      History   Chief Complaint Chief Complaint  Patient presents with   Cough   Nasal Congestion   Ear Fullness    HPI Chloe Stout is a 63 y.o. female.   HPI Pleasant 63 year old female presents with cough, congestion and bilateral ear drainage since yesterday.  Patient denies any known COVID 19 or flu exposure.  Patient reports history of sinus infections.  PMH significant for HTN and HLD.  Past Medical History:  Diagnosis Date   Hyperlipidemia    Hypertension     Patient Active Problem List   Diagnosis Date Noted   Right shoulder pain 05/13/2014   Thoracic back pain 06/25/2013   Essential hypertension 12/31/2009    Past Surgical History:  Procedure Laterality Date   CARPAL TUNNEL RELEASE Bilateral     OB History   No obstetric history on file.      Home Medications    Prior to Admission medications   Medication Sig Start Date End Date Taking? Authorizing Provider  ibuprofen (ADVIL) 800 MG tablet Take by mouth. 07/20/23  Yes [provider]  predniSONE (DELTASONE) 20 MG tablet Take 3 tabs PO daily x 5 days. 10/09/23  Yes Trevor Iha, FNP  losartan-hydrochlorothiazide (HYZAAR) 100-25 MG per tablet Take 1 tablet by mouth daily.    [provider]  venlafaxine XR (EFFEXOR-XR) 150 MG 24 hr capsule Take 150 mg by mouth daily with breakfast.    [provider]    Family History History reviewed. No pertinent family history.  Social History Social History   Tobacco Use   Smoking status: Former    Current packs/day: 3.00    Average packs/day: 3.0 packs/day for 11.0 years (33.0 ttl pk-yrs)    Types: Cigarettes   Smokeless tobacco: Former    Quit date: 04/08/2008  Substance Use Topics   Alcohol use: Yes     Allergies   Patient has no known allergies.   Review of Systems Review of Systems  Respiratory:  Positive for cough.   All other  systems reviewed and are negative.    Physical Exam Triage Vital Signs ED Triage Vitals  Encounter Vitals Group     BP      Systolic BP Percentile      Diastolic BP Percentile      Pulse      Resp      Temp      Temp src      SpO2      Weight      Height      Head Circumference      Peak Flow      Pain Score      Pain Loc      Pain Education      Exclude from Growth Chart    No data found.  Updated Vital Signs BP (!) 145/91 (BP Location: Left Arm)   Pulse 94   Temp 98.5 F (36.9 C) (Oral)   Resp 17   SpO2 97%   Physical Exam Vitals and nursing note reviewed.  Constitutional:      Appearance: Normal appearance. She is obese. She is not ill-appearing.  HENT:     Head: Normocephalic and atraumatic.     Right Ear: External ear normal.     Left Ear: External ear normal.     Mouth/Throat:     Mouth: Mucous membranes  are moist.     Pharynx: Oropharynx is clear.  Eyes:     Extraocular Movements: Extraocular movements intact.     Conjunctiva/sclera: Conjunctivae normal.     Pupils: Pupils are equal, round, and reactive to light.  Cardiovascular:     Rate and Rhythm: Normal rate and regular rhythm.     Pulses: Normal pulses.     Heart sounds: Normal heart sounds.  Pulmonary:     Effort: Pulmonary effort is normal.     Breath sounds: Normal breath sounds. No wheezing, rhonchi or rales.     Comments: Infrequent nonproductive cough on exam Musculoskeletal:        General: Normal range of motion.     Cervical back: Normal range of motion and neck supple.  Skin:    General: Skin is warm and dry.  Neurological:     General: No focal deficit present.     Mental Status: She is alert and oriented to person, place, and time. Mental status is at baseline.  Psychiatric:        Mood and Affect: Mood normal.        Behavior: Behavior normal.      UC Treatments / Results  Labs (all labs ordered are listed, but only abnormal results are displayed) Labs Reviewed - No  data to display  EKG   Radiology No results found.  Procedures Procedures (including critical care time)  Medications Ordered in UC Medications - No data to display  Initial Impression / Assessment and Plan / UC Course  I have reviewed the triage vital signs and the nursing notes.  Pertinent labs & imaging results that were available during my care of the patient were reviewed by me and considered in my medical decision making (see chart for details).     MDM: 1.  Cough, unspecified-Rx'd prednisone 20 mg tablet: Take 3 tabs p.o. daily x 5 days. Advised patient to take medication as directed with food to completion.  Encouraged to increase daily water intake to 64 ounces per day while taking this medication.  Advised if symptoms worsen and/or unresolved please follow-up with your PCP or here for further evaluation.  Patient discharged home, hemodynamically stable. Final Clinical Impressions(s) / UC Diagnoses   Final diagnoses:  Cough, unspecified type     Discharge Instructions      Advised patient to take medication as directed with food to completion.  Encouraged to increase daily water intake to 64 ounces per day while taking this medication.  Advised if symptoms worsen and/or unresolved please follow-up with your PCP or here for further evaluation.     ED Prescriptions     Medication Sig Dispense Auth. Provider   predniSONE (DELTASONE) 20 MG tablet Take 3 tabs PO daily x 5 days. 15 tablet Trevor Iha, FNP      PDMP not reviewed this encounter.   Trevor Iha, FNP 10/09/23 1427
